# Patient Record
Sex: Female | Born: 1945 | Race: Black or African American | Hispanic: No | Smoking: Never smoker
Health system: Southern US, Community
[De-identification: ages and names within clinical notes are randomized; demographics above are authoritative.]

## PROBLEM LIST (undated history)

## (undated) DIAGNOSIS — M1990 Unspecified osteoarthritis, unspecified site: Secondary | ICD-10-CM

## (undated) DIAGNOSIS — I10 Essential (primary) hypertension: Secondary | ICD-10-CM

## (undated) HISTORY — PX: HERNIA REPAIR: SHX51

## (undated) HISTORY — PX: CHOLECYSTECTOMY: SHX55

## (undated) HISTORY — PX: ABDOMINAL HYSTERECTOMY: SHX81

---

## 2014-09-22 DIAGNOSIS — Z90710 Acquired absence of both cervix and uterus: Secondary | ICD-10-CM | POA: Insufficient documentation

## 2014-09-22 DIAGNOSIS — K5730 Diverticulosis of large intestine without perforation or abscess without bleeding: Secondary | ICD-10-CM | POA: Insufficient documentation

## 2014-09-22 DIAGNOSIS — K5900 Constipation, unspecified: Secondary | ICD-10-CM | POA: Diagnosis not present

## 2014-09-22 DIAGNOSIS — Z8739 Personal history of other diseases of the musculoskeletal system and connective tissue: Secondary | ICD-10-CM | POA: Insufficient documentation

## 2014-09-22 DIAGNOSIS — Z9049 Acquired absence of other specified parts of digestive tract: Secondary | ICD-10-CM | POA: Insufficient documentation

## 2014-09-22 DIAGNOSIS — N281 Cyst of kidney, acquired: Secondary | ICD-10-CM | POA: Diagnosis not present

## 2014-09-22 DIAGNOSIS — I10 Essential (primary) hypertension: Secondary | ICD-10-CM | POA: Insufficient documentation

## 2014-09-22 DIAGNOSIS — R109 Unspecified abdominal pain: Secondary | ICD-10-CM | POA: Diagnosis present

## 2014-09-23 ENCOUNTER — Emergency Department (HOSPITAL_COMMUNITY)
Admission: EM | Admit: 2014-09-23 | Discharge: 2014-09-23 | Disposition: A | Discharge status: Home / Self Care | Payer: Medicare HMO | Attending: Emergency Medicine | Admitting: Emergency Medicine

## 2014-09-23 ENCOUNTER — Emergency Department (HOSPITAL_COMMUNITY): Payer: Medicare HMO

## 2014-09-23 ENCOUNTER — Encounter (HOSPITAL_COMMUNITY): Payer: Self-pay | Admitting: Emergency Medicine

## 2014-09-23 DIAGNOSIS — R109 Unspecified abdominal pain: Secondary | ICD-10-CM

## 2014-09-23 DIAGNOSIS — N281 Cyst of kidney, acquired: Secondary | ICD-10-CM

## 2014-09-23 DIAGNOSIS — K5730 Diverticulosis of large intestine without perforation or abscess without bleeding: Secondary | ICD-10-CM

## 2014-09-23 HISTORY — DX: Unspecified osteoarthritis, unspecified site: M19.90

## 2014-09-23 HISTORY — DX: Essential (primary) hypertension: I10

## 2014-09-23 LAB — CBC WITH DIFFERENTIAL/PLATELET
Basophils Absolute: 0 10*3/uL (ref 0.0–0.1)
Basophils Relative: 0 % (ref 0–1)
EOS ABS: 0.1 10*3/uL (ref 0.0–0.7)
Eosinophils Relative: 1 % (ref 0–5)
HEMATOCRIT: 33 % — AB (ref 36.0–46.0)
Hemoglobin: 11.6 g/dL — ABNORMAL LOW (ref 12.0–15.0)
Lymphocytes Relative: 40 % (ref 12–46)
Lymphs Abs: 3.6 10*3/uL (ref 0.7–4.0)
MCH: 30.1 pg (ref 26.0–34.0)
MCHC: 35.2 g/dL (ref 30.0–36.0)
MCV: 85.7 fL (ref 78.0–100.0)
MONOS PCT: 6 % (ref 3–12)
Monocytes Absolute: 0.5 10*3/uL (ref 0.1–1.0)
NEUTROS ABS: 4.8 10*3/uL (ref 1.7–7.7)
Neutrophils Relative %: 53 % (ref 43–77)
Platelets: 225 10*3/uL (ref 150–400)
RBC: 3.85 MIL/uL — AB (ref 3.87–5.11)
RDW: 13.1 % (ref 11.5–15.5)
WBC: 9 10*3/uL (ref 4.0–10.5)

## 2014-09-23 LAB — URINALYSIS, ROUTINE W REFLEX MICROSCOPIC
Glucose, UA: NEGATIVE mg/dL
Hgb urine dipstick: NEGATIVE
Ketones, ur: NEGATIVE mg/dL
Leukocytes, UA: NEGATIVE
Nitrite: NEGATIVE
PROTEIN: NEGATIVE mg/dL
Specific Gravity, Urine: 1.015 (ref 1.005–1.030)
Urobilinogen, UA: 2 mg/dL — ABNORMAL HIGH (ref 0.0–1.0)
pH: 7 (ref 5.0–8.0)

## 2014-09-23 LAB — COMPREHENSIVE METABOLIC PANEL
ALT: 92 U/L — ABNORMAL HIGH (ref 14–54)
ANION GAP: 9 (ref 5–15)
AST: 163 U/L — AB (ref 15–41)
Albumin: 3.5 g/dL (ref 3.5–5.0)
Alkaline Phosphatase: 187 U/L — ABNORMAL HIGH (ref 38–126)
BILIRUBIN TOTAL: 1.3 mg/dL — AB (ref 0.3–1.2)
BUN: 19 mg/dL (ref 6–20)
CHLORIDE: 99 mmol/L — AB (ref 101–111)
CO2: 29 mmol/L (ref 22–32)
Calcium: 9.3 mg/dL (ref 8.9–10.3)
Creatinine, Ser: 1 mg/dL (ref 0.44–1.00)
GFR calc Af Amer: >60 mL/min (ref >60)
GFR, EST NON AFRICAN AMERICAN: 56 mL/min — AB (ref >60)
Glucose, Bld: 119 mg/dL — ABNORMAL HIGH (ref 65–99)
Potassium: 2.8 mmol/L — ABNORMAL LOW (ref 3.5–5.1)
Sodium: 137 mmol/L (ref 135–145)
Total Protein: 7.2 g/dL (ref 6.5–8.1)

## 2014-09-23 LAB — I-STAT CG4 LACTIC ACID, ED: Lactic Acid, Venous: 1.17 mmol/L (ref 0.5–2.0)

## 2014-09-23 LAB — LIPASE, BLOOD: Lipase: 28 U/L (ref 22–51)

## 2014-09-23 MED ORDER — SODIUM CHLORIDE 0.9 % IV SOLN
1000.0000 mL | INTRAVENOUS | Status: DC
  Administered 2014-09-23: 1000 mL via INTRAVENOUS

## 2014-09-23 MED ORDER — MORPHINE SULFATE 4 MG/ML IJ SOLN: 4.0000 mg | Freq: Once | INTRAMUSCULAR | Status: DC

## 2014-09-23 MED ORDER — MORPHINE SULFATE 2 MG/ML IJ SOLN
2.0000 mg | Freq: Once | INTRAMUSCULAR | Status: AC
  Administered 2014-09-23: 2 mg via INTRAVENOUS
  Filled 2014-09-23: qty 1

## 2014-09-23 MED ORDER — IOHEXOL 300 MG/ML  SOLN
100.0000 mL | Freq: Once | INTRAMUSCULAR | Status: AC | PRN
  Administered 2014-09-23: 100 mL via INTRAVENOUS

## 2014-09-23 MED ORDER — ONDANSETRON HCL 4 MG/2ML IJ SOLN
4.0000 mg | Freq: Once | INTRAMUSCULAR | Status: AC
  Administered 2014-09-23: 4 mg via INTRAVENOUS
  Filled 2014-09-23: qty 2

## 2014-09-23 MED ORDER — MAGNESIUM CITRATE PO SOLN
1.0000 | Freq: Once | ORAL | Status: AC
  Administered 2014-09-23: 1 via ORAL
  Filled 2014-09-23: qty 296

## 2014-09-23 MED ORDER — ONDANSETRON HCL 4 MG PO TABS: 4.0000 mg | ORAL_TABLET | Freq: Four times a day (QID) | ORAL | Status: AC | PRN

## 2014-09-23 MED ORDER — TRAMADOL HCL 50 MG PO TABS: 50.0000 mg | ORAL_TABLET | Freq: Four times a day (QID) | ORAL | Status: AC | PRN

## 2014-09-23 MED ORDER — MIDAZOLAM HCL 2 MG/2ML IJ SOLN
2.0000 mg | Freq: Once | INTRAMUSCULAR | Status: AC
  Administered 2014-09-23: 2 mg via INTRAVENOUS
  Filled 2014-09-23: qty 2

## 2014-09-23 MED ORDER — IOHEXOL 300 MG/ML  SOLN: 25.0000 mL | Freq: Once | INTRAMUSCULAR | Status: DC | PRN

## 2014-09-23 MED ORDER — SODIUM CHLORIDE 0.9 % IV SOLN
1000.0000 mL | Freq: Once | INTRAVENOUS | Status: AC
  Administered 2014-09-23: 1000 mL via INTRAVENOUS

## 2014-09-23 MED ORDER — LORAZEPAM 2 MG/ML IJ SOLN
1.0000 mg | Freq: Once | INTRAMUSCULAR | Status: AC
  Administered 2014-09-23: 1 mg via INTRAVENOUS
  Filled 2014-09-23: qty 1

## 2014-09-23 MED ORDER — POTASSIUM CHLORIDE 10 MEQ/100ML IV SOLN
10.0000 meq | Freq: Once | INTRAVENOUS | Status: AC
  Administered 2014-09-23: 10 meq via INTRAVENOUS
  Filled 2014-09-23: qty 100

## 2014-09-23 NOTE — ED Notes (Signed)
Pt finished with oral CT contrast. Thayer Ohmhris from CT informed.

## 2014-09-23 NOTE — ED Notes (Signed)
Ct scan called to determine the delay in transporting the patient to CT and was informed the patient is next. Pt updated.

## 2014-09-23 NOTE — ED Notes (Signed)
Pt reports "Honestly, I burped and passed gas and my pain is gone."

## 2014-09-23 NOTE — ED Notes (Signed)
Patient transported to CT 

## 2014-09-23 NOTE — Discharge Instructions (Signed)
Return if symptoms are getting worse.   Abdominal Pain Many things can cause abdominal pain. Usually, abdominal pain is not caused by a disease and will improve without treatment. It can often be observed and treated at home. Your health care provider will do a physical exam and possibly order blood tests and X-rays to help determine the seriousness of your pain. However, in many cases, more time must pass before a clear cause of the pain can be found. Before that point, your health care provider may not know if you need more testing or further treatment. HOME CARE INSTRUCTIONS  Monitor your abdominal pain for any changes. The following actions may help to alleviate any discomfort you are experiencing:  Only take over-the-counter or prescription medicines as directed by your health care provider.  Do not take laxatives unless directed to do so by your health care provider.  Try a clear liquid diet (broth, tea, or water) as directed by your health care provider. Slowly move to a bland diet as tolerated. SEEK MEDICAL CARE IF:  You have unexplained abdominal pain.  You have abdominal pain associated with nausea or diarrhea.  You have pain when you urinate or have a bowel movement.  You experience abdominal pain that wakes you in the night.  You have abdominal pain that is worsened or improved by eating food.  You have abdominal pain that is worsened with eating fatty foods.  You have a fever. SEEK IMMEDIATE MEDICAL CARE IF:   Your pain does not go away within 2 hours.  You keep throwing up (vomiting).  Your pain is felt only in portions of the abdomen, such as the right side or the left lower portion of the abdomen.  You pass bloody or black tarry stools. MAKE SURE YOU:  Understand these instructions.   Will watch your condition.   Will get help right away if you are not doing well or get worse.  Document Released: 12/19/2004 Document Revised: 03/16/2013 Document Reviewed:  11/18/2012 [facility] Patient Information 2015 [place], [place]. This information is not intended to replace advice given to you by your health care provider. Make sure you discuss any questions you have with your health care provider.  Ondansetron tablets What is this medicine? ONDANSETRON (on DAN se tron) is used to treat nausea and vomiting caused by chemotherapy. It is also used to prevent or treat nausea and vomiting after surgery. This medicine may be used for other purposes; ask your health care provider or pharmacist if you have questions. COMMON BRAND NAME(S): Zofran What should I tell my health care provider before I take this medicine? They need to know if you have any of these conditions: -heart disease -history of irregular heartbeat -liver disease -low levels of magnesium or potassium in the blood -an unusual or allergic reaction to ondansetron, granisetron, other medicines, foods, dyes, or preservatives -pregnant or trying to get pregnant -breast-feeding How should I use this medicine? Take this medicine by mouth with a glass of water. Follow the directions on your prescription label. Take your doses at regular intervals. Do not take your medicine more often than directed. Talk to your pediatrician regarding the use of this medicine in children. Special care may be needed. Overdosage: If you think you have taken too much of this medicine contact a poison control center or emergency room at once. NOTE: This medicine is only for you. Do not share this medicine with others. What if I miss a dose? If you miss a dose,  take it as soon as you can. If it is almost time for your next dose, take only that dose. Do not take double or extra doses. What may interact with this medicine? Do not take this medicine with any of the following medications: -apomorphine -certain medicines for fungal infections like fluconazole, itraconazole, ketoconazole, posaconazole,  voriconazole -cisapride -dofetilide -dronedarone -pimozide -thioridazine -ziprasidone This medicine may also interact with the following medications: -carbamazepine -certain medicines for depression, anxiety, or psychotic disturbances -fentanyl -linezolid -MAOIs like Carbex, Eldepryl, Marplan, Nardil, and Parnate -methylene blue (injected into a vein) -other medicines that prolong the QT interval (cause an abnormal heart rhythm) -phenytoin -rifampicin -tramadol This list may not describe all possible interactions. Give your health care provider a list of all the medicines, herbs, non-prescription drugs, or dietary supplements you use. Also tell them if you smoke, drink alcohol, or use illegal drugs. Some items may interact with your medicine. What should I watch for while using this medicine? Check with your doctor or health care professional right away if you have any sign of an allergic reaction. What side effects may I notice from receiving this medicine? Side effects that you should report to your doctor or health care professional as soon as possible: -allergic reactions like skin rash, itching or hives, swelling of the face, lips or tongue -breathing problems -confusion -dizziness -fast or irregular heartbeat -feeling faint or lightheaded, falls -fever and chills -loss of balance or coordination -seizures -sweating -swelling of the hands or feet -tightness in the chest -tremors -unusually weak or tired Side effects that usually do not require medical attention (report to your doctor or health care professional if they continue or are bothersome): -constipation or diarrhea -headache This list may not describe all possible side effects. Call your doctor for medical advice about side effects. You may report side effects to FDA at 1-800-FDA-1088. Where should I keep my medicine? Keep out of the reach of children. Store between 2 and 30 degrees C (36 and 86 degrees F).  Throw away any unused medicine after the expiration date. NOTE: This sheet is a summary. It may not cover all possible information. If you have questions about this medicine, talk to your doctor, pharmacist, or health care provider.  2015, [employer]vier/Gold Standard. (2012-12-16 16:27:45)  Tramadol tablets What is this medicine? TRAMADOL (TRA ma dole) is a pain reliever. It is used to treat moderate to severe pain in adults. This medicine may be used for other purposes; ask your health care provider or pharmacist if you have questions. COMMON BRAND NAME(S): Ultram What should I tell my health care provider before I take this medicine? They need to know if you have any of these conditions: -brain tumor -depression -drug abuse or addiction -head injury -if you frequently drink alcohol containing drinks -kidney disease or trouble passing urine -liver disease -lung disease, asthma, or breathing problems -seizures or epilepsy -suicidal thoughts, plans, or attempt; a previous suicide attempt by you or a family member -an unusual or allergic reaction to tramadol, codeine, other medicines, foods, dyes, or preservatives -pregnant or trying to get pregnant -breast-feeding How should I use this medicine? Take this medicine by mouth with a full glass of water. Follow the directions on the prescription label. If the medicine upsets your stomach, take it with food or milk. Do not take more medicine than you are told to take. Talk to your pediatrician regarding the use of this medicine in children. Special care may be needed. Overdosage: If you think  you have taken too much of this medicine contact a poison control center or emergency room at once. NOTE: This medicine is only for you. Do not share this medicine with others. What if I miss a dose? If you miss a dose, take it as soon as you can. If it is almost time for your next dose, take only that dose. Do not take double or extra doses. What may  interact with this medicine? Do not take this medicine with any of the following medications: -MAOIs like Carbex, Eldepryl, Marplan, Nardil, and Parnate This medicine may also interact with the following medications: -alcohol or medicines that contain alcohol -antihistamines -benzodiazepines -bupropion -carbamazepine or oxcarbazepine -clozapine -cyclobenzaprine -digoxin -furazolidone -linezolid -medicines for depression, anxiety, or psychotic disturbances -medicines for migraine headache like almotriptan, eletriptan, frovatriptan, naratriptan, rizatriptan, sumatriptan, zolmitriptan -medicines for pain like pentazocine, buprenorphine, butorphanol, meperidine, nalbuphine, and propoxyphene -medicines for sleep -muscle relaxants -naltrexone -phenobarbital -phenothiazines like perphenazine, thioridazine, chlorpromazine, mesoridazine, fluphenazine, prochlorperazine, promazine, and trifluoperazine -procarbazine -warfarin This list may not describe all possible interactions. Give your health care provider a list of all the medicines, herbs, non-prescription drugs, or dietary supplements you use. Also tell them if you smoke, drink alcohol, or use illegal drugs. Some items may interact with your medicine. What should I watch for while using this medicine? Tell your doctor or health care professional if your pain does not go away, if it gets worse, or if you have new or a different type of pain. You may develop tolerance to the medicine. Tolerance means that you will need a higher dose of the medicine for pain relief. Tolerance is normal and is expected if you take this medicine for a long time. Do not suddenly stop taking your medicine because you may develop a severe reaction. Your body becomes used to the medicine. This does NOT mean you are addicted. Addiction is a behavior related to getting and using a drug for a non-medical reason. If you have pain, you have a medical reason to take pain  medicine. Your doctor will tell you how much medicine to take. If your doctor wants you to stop the medicine, the dose will be slowly lowered over time to avoid any side effects. You may get drowsy or dizzy. Do not drive, use machinery, or do anything that needs mental alertness until you know how this medicine affects you. Do not stand or sit up quickly, especially if you are an older patient. This reduces the risk of dizzy or fainting spells. Alcohol can increase or decrease the effects of this medicine. Avoid alcoholic drinks. You may have constipation. Try to have a bowel movement at least every 2 to 3 days. If you do not have a bowel movement for 3 days, call your doctor or health care professional. Your mouth may get dry. Chewing sugarless gum or sucking hard candy, and drinking plenty of water may help. Contact your doctor if the problem does not go away or is severe. What side effects may I notice from receiving this medicine? Side effects that you should report to your doctor or health care professional as soon as possible: -allergic reactions like skin rash, itching or hives, swelling of the face, lips, or tongue -breathing difficulties, wheezing -confusion -itching -light headedness or fainting spells -redness, blistering, peeling or loosening of the skin, including inside the mouth -seizures Side effects that usually do not require medical attention (report to your doctor or health care professional if they continue or are bothersome): -constipation -  dizziness -drowsiness -headache -nausea, vomiting This list may not describe all possible side effects. Call your doctor for medical advice about side effects. You may report side effects to FDA at 1-800-FDA-1088. Where should I keep my medicine? Keep out of the reach of children. Store at room temperature between 15 and 30 degrees C (59 and 86 degrees F). Keep container tightly closed. Throw away any unused medicine after the  expiration date. NOTE: This sheet is a summary. It may not cover all possible information. If you have questions about this medicine, talk to your doctor, pharmacist, or health care provider.  2015, Elsevier/Gold Standard. (2009-11-22 11:55:44)

## 2014-09-23 NOTE — ED Provider Notes (Signed)
CSN: [id number]     Arrival date & time 09/22/14  2352 History   This chart was scribed for Dione Booze, MD by Arlan Organ, ED Scribe. This patient was seen in room A10C/A10C and the patient's care was started 12:38 AM.   Chief Complaint  Patient presents with  . Abdominal Pain   The history is provided by the patient. No language interpreter was used.    HPI Comments: Teresa Rodgers is a 69 y.o. female without any pertinent past medical history who presents to the Emergency Department complaining of constant, ongoing, unchanged diffuse abdominal pain x  1 day. No aggravating factors at this time. However, pain is mildly alleviated when passing gas and when burping. Pt also reports ongoing nausea and constipation. No OTC medications or home remedies attempted prior to arrival. No recent fever, chills, vomiting, chest pain, or shortness of breath. She is from out of town and visiting family here in [place]. PMHx includes HTN. No known allergies to medications.  Past Medical History  Diagnosis Date  . Arthritis   . Hypertension    Past Surgical History  Procedure Laterality Date  . Abdominal hysterectomy    . Hernia repair    . Cholecystectomy     No family history on file. History  Substance Use Topics  . Smoking status: Never Smoker   . Smokeless tobacco: Not on file  . Alcohol Use: No   OB History    No data available     Review of Systems  Constitutional: Negative for fever and chills.  Respiratory: Negative for shortness of breath.   Cardiovascular: Negative for chest pain.  Gastrointestinal: Positive for nausea, abdominal pain and constipation. Negative for vomiting and diarrhea.  Psychiatric/Behavioral: Negative for confusion.  All other systems reviewed and are negative.     Allergies  Review of patient's allergies indicates no known allergies.  Home Medications   Prior to Admission medications   Not on File   Triage Vitals: BP 135/72 mmHg  Pulse 70   Temp(Src) 97.5 F (36.4 C) (Oral)  Resp 20  SpO2 100%   Physical Exam  Constitutional: She is oriented to person, place, and time. She appears well-developed and well-nourished. No distress.  HENT:  Head: Normocephalic and atraumatic.  Eyes: EOM are normal. Pupils are equal, round, and reactive to light.  Neck: Normal range of motion. Neck supple. No JVD present.  Cardiovascular: Normal rate, regular rhythm and normal heart sounds.   No murmur heard. Pulmonary/Chest: Effort normal and breath sounds normal. She has no wheezes. She has no rales. She exhibits no tenderness.  Abdominal: Soft. Bowel sounds are normal. She exhibits no distension and no mass. There is tenderness. There is no rebound and no guarding.  Moderate RUQ tenderness Marked RLQ tenderness  Musculoskeletal: Normal range of motion. She exhibits no edema.  Lymphadenopathy:    She has no cervical adenopathy.  Neurological: She is alert and oriented to person, place, and time. No cranial nerve deficit. She exhibits normal muscle tone. Coordination normal.  Skin: Skin is warm and dry. No rash noted.  Psychiatric: She has a normal mood and affect. Her behavior is normal. Judgment and thought content normal.  Nursing note and vitals reviewed.   ED Course  Procedures (including critical care time)  DIAGNOSTIC STUDIES: Oxygen Saturation is 100% on RA, Normal by my interpretation.    COORDINATION OF CARE: 12:42 AM- Will order CBC, BMP, Lipase, urinalysis. Will give fluids, Morphine, and Zofran. Discussed treatment  plan with pt at bedside and pt agreed to plan.     Labs Review Results for orders placed or performed during the hospital encounter of 09/23/14  CBC with Differential  Result Value Ref Range   WBC 9.0 4.0 - 10.5 K/uL   RBC 3.85 (L) 3.87 - 5.11 MIL/uL   Hemoglobin 11.6 (L) 12.0 - 15.0 g/dL   HCT 16.1 (L) 09.6 - 04.5 %   MCV 85.7 78.0 - 100.0 fL   MCH 30.1 26.0 - 34.0 pg   MCHC 35.2 30.0 - 36.0 g/dL   RDW  40.9 81.1 - 91.4 %   Platelets 225 150 - 400 K/uL   Neutrophils Relative % 53 43 - 77 %   Neutro Abs 4.8 1.7 - 7.7 K/uL   Lymphocytes Relative 40 12 - 46 %   Lymphs Abs 3.6 0.7 - 4.0 K/uL   Monocytes Relative 6 3 - 12 %   Monocytes Absolute 0.5 0.1 - 1.0 K/uL   Eosinophils Relative 1 0 - 5 %   Eosinophils Absolute 0.1 0.0 - 0.7 K/uL   Basophils Relative 0 0 - 1 %   Basophils Absolute 0.0 0.0 - 0.1 K/uL  Comprehensive metabolic panel  Result Value Ref Range   Sodium 137 135 - 145 mmol/L   Potassium 2.8 (L) 3.5 - 5.1 mmol/L   Chloride 99 (L) 101 - 111 mmol/L   CO2 29 22 - 32 mmol/L   Glucose, Bld 119 (H) 65 - 99 mg/dL   BUN 19 6 - 20 mg/dL   Creatinine, Ser 7.82 0.44 - 1.00 mg/dL   Calcium 9.3 8.9 - 95.6 mg/dL   Total Protein 7.2 6.5 - 8.1 g/dL   Albumin 3.5 3.5 - 5.0 g/dL   AST 213 (H) 15 - 41 U/L   ALT 92 (H) 14 - 54 U/L   Alkaline Phosphatase 187 (H) 38 - 126 U/L   Total Bilirubin 1.3 (H) 0.3 - 1.2 mg/dL   GFR calc non Af Amer 56 (L) >60 mL/min   GFR calc Af Amer >60 >60 mL/min   Anion gap 9 5 - 15  Lipase, blood  Result Value Ref Range   Lipase 28 22 - 51 U/L  Urinalysis, Routine w reflex microscopic (not at [facility])  Result Value Ref Range   Color, Urine AMBER (A) YELLOW   APPearance CLEAR CLEAR   Specific Gravity, Urine 1.015 1.005 - 1.030   pH 7.0 5.0 - 8.0   Glucose, UA NEGATIVE NEGATIVE mg/dL   Hgb urine dipstick NEGATIVE NEGATIVE   Bilirubin Urine SMALL (A) NEGATIVE   Ketones, ur NEGATIVE NEGATIVE mg/dL   Protein, ur NEGATIVE NEGATIVE mg/dL   Urobilinogen, UA 2.0 (H) 0.0 - 1.0 mg/dL   Nitrite NEGATIVE NEGATIVE   Leukocytes, UA NEGATIVE NEGATIVE  I-Stat CG4 Lactic Acid, ED  Result Value Ref Range   Lactic Acid, Venous 1.17 0.5 - 2.0 mmol/L   Imaging Review Ct Abdomen Pelvis W Contrast  09/23/2014   CLINICAL DATA:  Generalized abdominal pain with nausea, onset yesterday afternoon.  EXAM: CT ABDOMEN AND PELVIS WITH CONTRAST  TECHNIQUE: Multidetector CT imaging  of the abdomen and pelvis was performed using the standard protocol following bolus administration of intravenous contrast.  CONTRAST:  OMNIPAQUE IOHEXOL 300 MG/ML  SOLN  COMPARISON:  None.  FINDINGS: Lower chest:  No significant abnormality  Hepatobiliary: There is prior cholecystectomy. The liver is normal. Bile ducts are normal.  Pancreas: Normal  Spleen: Normal  Adrenals/Urinary Tract:  There is a 1.7 cm simple right renal cyst laterally and smaller cyst on the left. Kidneys and adrenals are otherwise normal in appearance. No urinary calculi are evident.  Stomach/Bowel: The stomach and small bowel appear normal. There is extensive colonic diverticulosis. There is no evidence of diverticulitis or other acute inflammatory process.  Vascular/Lymphatic: The abdominal aorta is normal in caliber. There is no atherosclerotic calcification. There is no adenopathy in the abdomen or pelvis.  Reproductive: There is prior hysterectomy. No adnexal abnormalities are evident.  Other: There is no ascites.  Musculoskeletal: No significant abnormality. There is moderately severe lumbar facet arthritis at L4-5 and L5-S1.  IMPRESSION: No acute findings are evident in the abdomen or pelvis.  Diverticulosis.  Severe lower lumbar facet arthritis.   Electronically Signed   By: Ellery Plunkaniel R Mitchell M.D.   On: 09/23/2014 04:44   Images viewed by me.  MDM   Final diagnoses:  Abdominal pain, unspecified abdominal location  Renal cyst  Diverticulosis of large intestine without hemorrhage    Abdominal pain of uncertain cause. I am concerned about possible appendicitis. Patient thinks she had an incidental appendectomy at time of hysterectomy but there is no way to confirm that. She is sent for CT scan. She is extremely claustrophobic and received sedation with lorazepam and then midazolam in order to allow the patient to go through with CT scanning. CT scan shows no acute process. Incidental findings of diverticulosis and renal  cysts. Patient is advised of these findings. She is discharged with prescriptions for ondansetron and tramadol. She is instructed to return if her pain worsens. She is visiting from out of town and is to follow-up with her PCP.   I personally performed the services described in this documentation, which was scribed in my presence. The recorded information has been reviewed and is accurate.     Dione Boozeavid Arin Vanosdol, MD 09/23/14 [phone]

## 2014-09-23 NOTE — ED Notes (Signed)
Called Dr. Preston FleetingGlick to report transporter is here for patient to travel to [place]. She requests medication for being claustraphobic. MD acknowledges, awaiting new orders.

## 2014-09-23 NOTE — ED Notes (Signed)
Pt. reports generalized abdominal pain with nausea onset yesterday afternoon , denies emesis or diarrhea. No fever or chills.

## 2016-09-04 IMAGING — CT CT ABD-PELV W/ CM
2 of 5 series · 12 of 46 positions shown, 14 images · IV contrast (omnipaque)
Comparison: None.

CLINICAL DATA: Generalized abdominal pain with nausea, onset
yesterday afternoon.

EXAM:
CT ABDOMEN AND PELVIS WITH CONTRAST
TECHNIQUE: Multidetector CT imaging of the abdomen and pelvis was performed
using the standard protocol following bolus administration of
intravenous contrast.
CONTRAST:  100mL OMNIPAQUE IOHEXOL 300 MG/ML  SOLN

[Series 201: routine, idose (2) · axial · 0.63mm/px · z∈[-723,-328]mm · 9 of 94 slices shown, 11 images]
[im 10/94  soft-tissue]
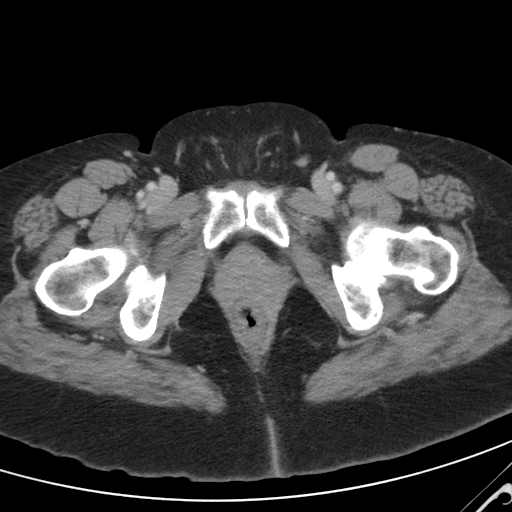
[im 10/94  bone]
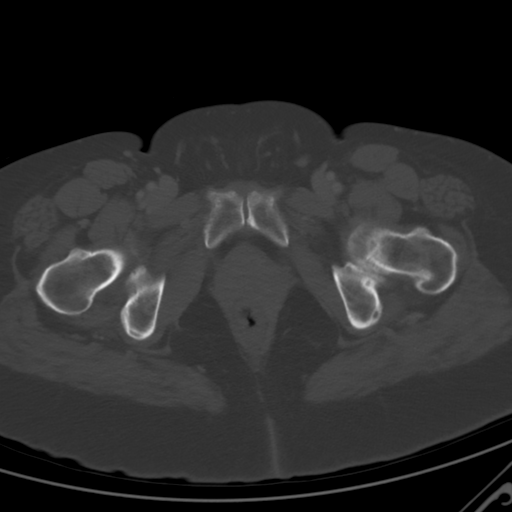
[im 20/94  soft-tissue]
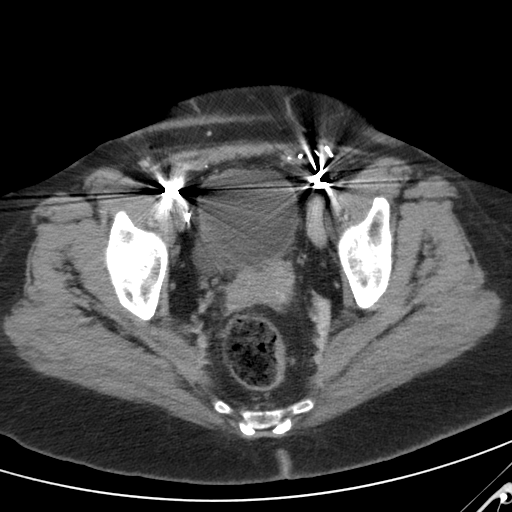
[im 30/94  soft-tissue]
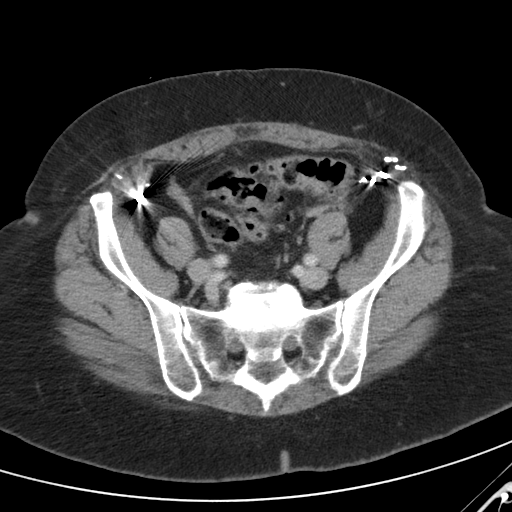
[im 40/94  soft-tissue]
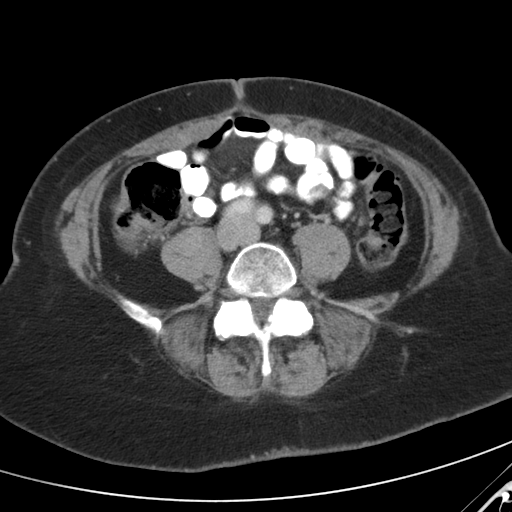
[im 49/94  soft-tissue]
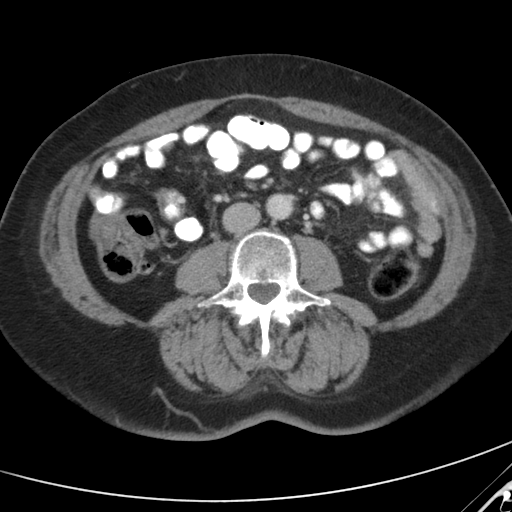
[im 59/94  soft-tissue]
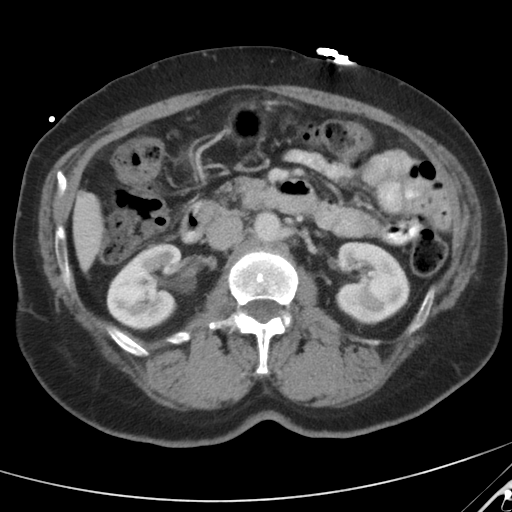
[im 69/94  soft-tissue]
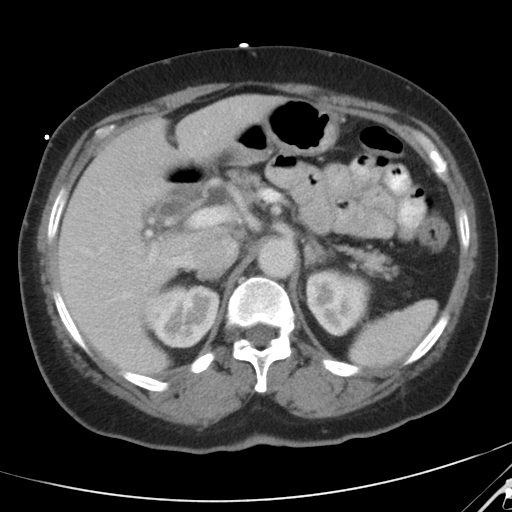
[im 79/94  soft-tissue]
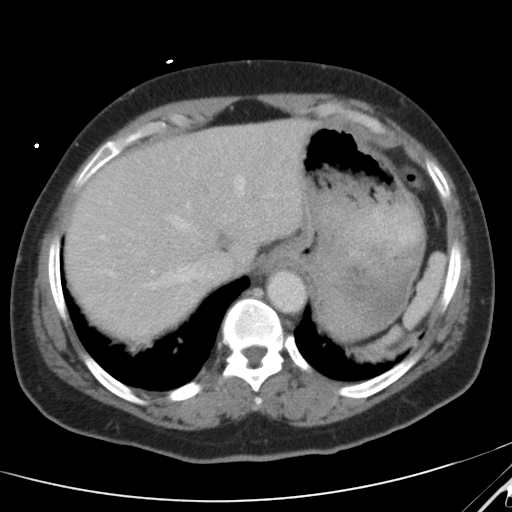
[im 89/94  soft-tissue]
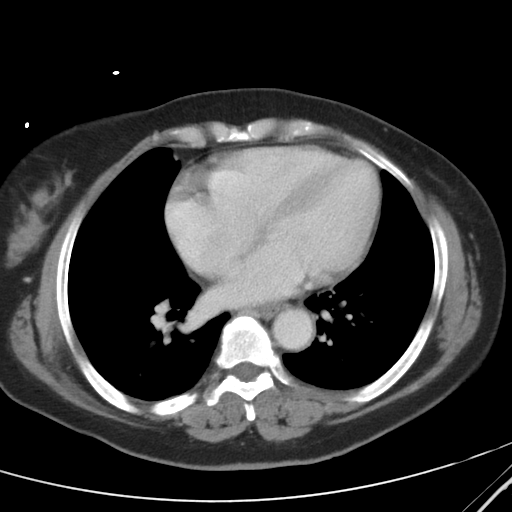
[im 89/94  bone]
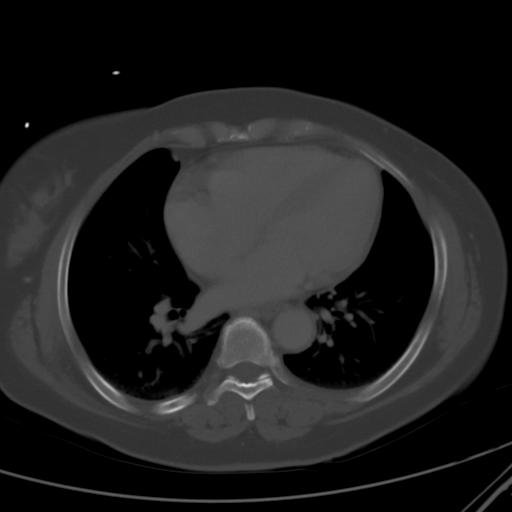

[Series 203: coronals, idose (2) · coronal · 0.45mm/px · 3 of 110 slices shown]
[im 37/110  soft-tissue]
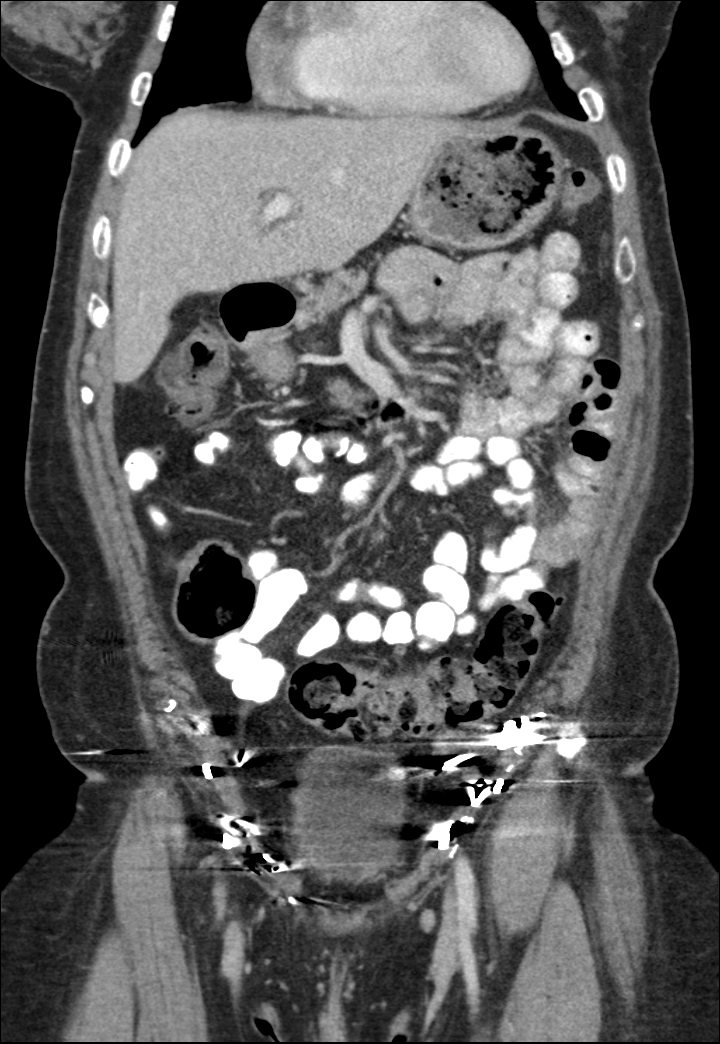
[im 49/110  soft-tissue]
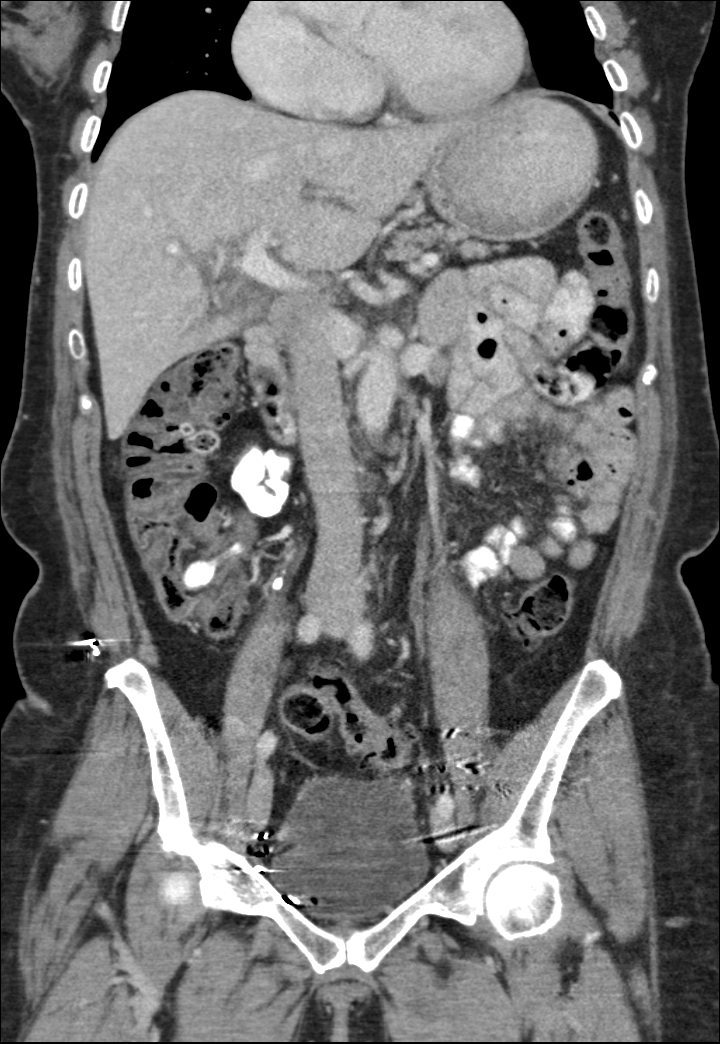
[im 61/110  soft-tissue]
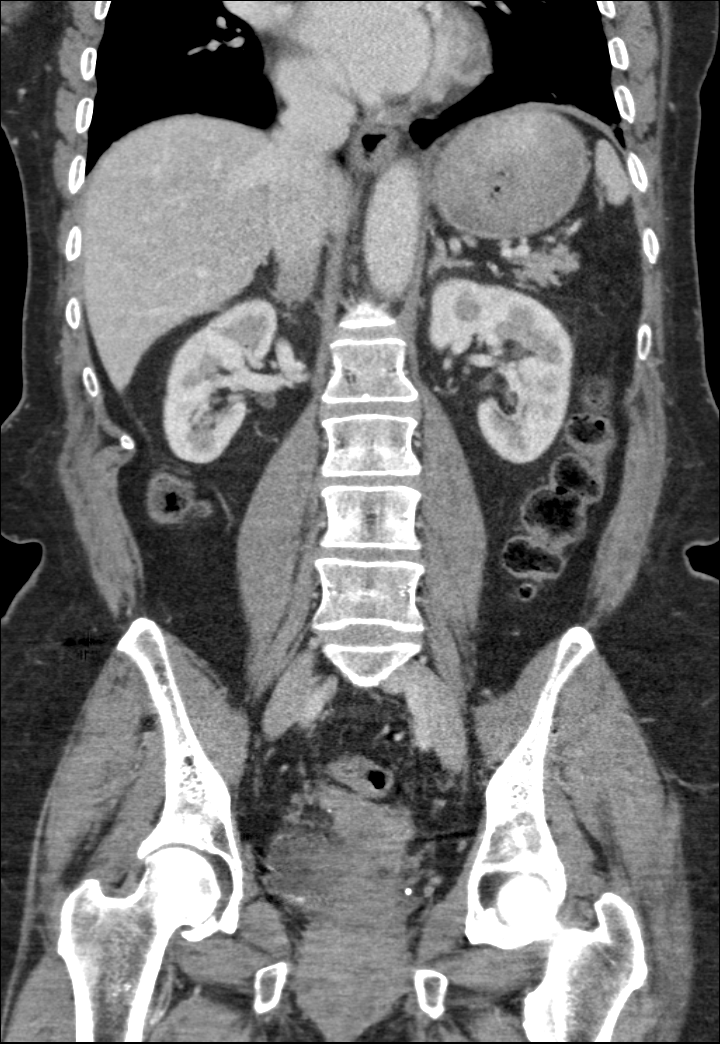

[12 of 46 positions shown; findings below may reference images not displayed]

FINDINGS: Lower chest:  No significant abnormality

Hepatobiliary: There is prior cholecystectomy. The liver is normal.
Bile ducts are normal.

Pancreas: Normal

Spleen: Normal

Adrenals/Urinary Tract: There is a 1.7 cm simple right renal cyst
laterally and smaller cyst on the left. Kidneys and adrenals are
otherwise normal in appearance. No urinary calculi are evident.

Stomach/Bowel: The stomach and small bowel appear normal. There is
extensive colonic diverticulosis. There is no evidence of
diverticulitis or other acute inflammatory process.

Vascular/Lymphatic: The abdominal aorta is normal in caliber. There
is no atherosclerotic calcification. There is no adenopathy in the
abdomen or pelvis.

Reproductive: There is prior hysterectomy. No adnexal abnormalities
are evident.

Other: There is no ascites.

Musculoskeletal: No significant abnormality. There is moderately
severe lumbar facet arthritis at L4-5 and L5-S1.
IMPRESSION: No acute findings are evident in the abdomen or pelvis.

Diverticulosis.

Severe lower lumbar facet arthritis.
